# Patient Record
Sex: Male | Born: 1990 | Race: Black or African American | Hispanic: No | Marital: Single | Smoking: Never smoker
Health system: Southern US, Community
[De-identification: ages and names within clinical notes are randomized; demographics above are authoritative.]

## PROBLEM LIST (undated history)

## (undated) HISTORY — PX: FINGER SURGERY: SHX640

---
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
## 2011-03-14 ENCOUNTER — Emergency Department (HOSPITAL_BASED_OUTPATIENT_CLINIC_OR_DEPARTMENT_OTHER)
Admission: EM | Admit: 2011-03-14 | Discharge: 2011-03-14 | Disposition: A | Discharge status: Home / Self Care | Payer: Self-pay | Attending: Emergency Medicine | Admitting: Emergency Medicine

## 2011-03-14 ENCOUNTER — Encounter (HOSPITAL_BASED_OUTPATIENT_CLINIC_OR_DEPARTMENT_OTHER): Payer: Self-pay | Admitting: *Deleted

## 2011-03-14 ENCOUNTER — Emergency Department (INDEPENDENT_AMBULATORY_CARE_PROVIDER_SITE_OTHER): Payer: Self-pay

## 2011-03-14 DIAGNOSIS — R1031 Right lower quadrant pain: Secondary | ICD-10-CM | POA: Insufficient documentation

## 2011-03-14 DIAGNOSIS — I880 Nonspecific mesenteric lymphadenitis: Secondary | ICD-10-CM | POA: Insufficient documentation

## 2011-03-14 LAB — DIFFERENTIAL
Eosinophils Relative: 10 % — ABNORMAL HIGH (ref 0–5)
Lymphs Abs: 1.8 10*3/uL (ref 0.7–4.0)
Monocytes Absolute: 0.9 10*3/uL (ref 0.1–1.0)

## 2011-03-14 LAB — URINALYSIS, ROUTINE W REFLEX MICROSCOPIC
Glucose, UA: NEGATIVE mg/dL
Ketones, ur: NEGATIVE mg/dL
Leukocytes, UA: NEGATIVE
Nitrite: NEGATIVE
Specific Gravity, Urine: 1.017 (ref 1.005–1.030)
pH: 6.5 (ref 5.0–8.0)

## 2011-03-14 LAB — COMPREHENSIVE METABOLIC PANEL
Alkaline Phosphatase: 57 U/L (ref 39–117)
BUN: 10 mg/dL (ref 6–23)
CO2: 27 mEq/L (ref 19–32)
GFR calc Af Amer: >90 mL/min (ref >90)
GFR calc non Af Amer: >90 mL/min (ref >90)
Glucose, Bld: 84 mg/dL (ref 70–99)
Potassium: 3.9 mEq/L (ref 3.5–5.1)
Total Bilirubin: 0.2 mg/dL — ABNORMAL LOW (ref 0.3–1.2)
Total Protein: 6.5 g/dL (ref 6.0–8.3)

## 2011-03-14 LAB — CBC
HCT: 38.8 % — ABNORMAL LOW (ref 39.0–52.0)
Hemoglobin: 13.3 g/dL (ref 13.0–17.0)
Platelets: 243 10*3/uL (ref 150–400)
RBC: 5.58 MIL/uL (ref 4.22–5.81)
RDW: 14.2 % (ref 11.5–15.5)
WBC: 6.3 10*3/uL (ref 4.0–10.5)

## 2011-03-14 MED ORDER — SODIUM CHLORIDE 0.9 % IV BOLUS (SEPSIS)
1000.0000 mL | Freq: Once | INTRAVENOUS | Status: AC
  Administered 2011-03-14: 1000 mL via INTRAVENOUS

## 2011-03-14 MED ORDER — IBUPROFEN 600 MG PO TABS: 600.0000 mg | ORAL_TABLET | Freq: Four times a day (QID) | ORAL | Status: AC | PRN

## 2011-03-14 MED ORDER — MORPHINE SULFATE 4 MG/ML IJ SOLN
4.0000 mg | Freq: Once | INTRAMUSCULAR | Status: AC
  Administered 2011-03-14: 4 mg via INTRAVENOUS
  Filled 2011-03-14: qty 1

## 2011-03-14 MED ORDER — IOHEXOL 300 MG/ML  SOLN
100.0000 mL | Freq: Once | INTRAMUSCULAR | Status: AC | PRN
  Administered 2011-03-14: 100 mL via INTRAVENOUS

## 2011-03-14 NOTE — ED Notes (Signed)
PO contrast provided. Family at bedside.

## 2011-03-14 NOTE — ED Notes (Signed)
Report received from Mary, RN, care assumed.

## 2011-03-14 NOTE — ED Notes (Signed)
Pt presnts to ED today with c/o RLQ pain with associated n/v since Friday.  Pt took Pepto last night with no relief in sx

## 2011-03-14 NOTE — ED Provider Notes (Signed)
History     CSN: [id number]  Arrival date & time 03/14/11  6295   First MD Initiated Contact with Patient 03/14/11 [phone]      Chief Complaint  Patient presents with  . Abdominal Pain    (Consider location/radiation/quality/duration/timing/severity/associated sxs/prior treatment) HPI  21 year old male previous healthy presents with abdominal pain. Patient states he has experience right lower quadrant abdominal pain which is been constant for the past 3 days. Exacerbating factors include moving around in ED. He denies fevers, chills. He denies constipation, diarrhea. he's had nausea without vomiting. Denies back pain. Denies pain in his genitals. No history of abdominal surgeries. States he's occasionally felt bulge to the same area. Denies hematuria/dysuria/freq/urgency   ED Notes, ED Provider Notes from 03/14/11 0000 to 03/14/11 06:53:19       Trula Ore, RN 03/14/2011 06:52      Pt presnts to ED today with c/o RLQ pain with associated n/v since Friday. Pt took Pepto last night with no relief in sx    History reviewed. No pertinent past medical history.  History reviewed. No pertinent past surgical history.  History reviewed. No pertinent family history.  History  Substance Use Topics  . Smoking status: Not on file  . Smokeless tobacco: Not on file  . Alcohol Use: Not on file    Review of Systems  All other systems reviewed and are negative.   except as noted HPI   Allergies  Review of patient's allergies indicates no known allergies.  Home Medications   Current Outpatient Rx  Name Route Sig Dispense Refill  . IBUPROFEN 600 MG PO TABS Oral Take 1 tablet (600 mg total) by mouth every 6 (six) hours as needed for pain. 30 tablet 0    BP 108/64  Pulse 84  Temp(Src) 97.8 F (36.6 C) (Oral)  Resp 20  Ht 5\' 11"  (1.803 m)  Wt 140 lb (63.504 kg)  BMI 19.53 kg/m2  SpO2 100%  Physical Exam  Nursing note and vitals reviewed. Constitutional: He is oriented to  person, place, and time. He appears well-developed and well-nourished. No distress.  HENT:  Head: Atraumatic.  Mouth/Throat: Oropharynx is clear and moist.  Eyes: Conjunctivae are normal. Pupils are equal, round, and reactive to light.  Neck: Neck supple.  Cardiovascular: Normal rate, regular rhythm, normal heart sounds and intact distal pulses.  Exam reveals no gallop and no friction rub.   No murmur heard. Pulmonary/Chest: Effort normal. No respiratory distress. He has no wheezes. He has no rales.  Abdominal: Soft. Bowel sounds are normal. There is tenderness. There is no rebound and no guarding.       +RLQ ttp no hernia palpated  Genitourinary: Penis normal. No penile tenderness.  Musculoskeletal: Normal range of motion. He exhibits no edema and no tenderness.  Neurological: He is alert and oriented to person, place, and time.  Skin: Skin is warm and dry.  Psychiatric: He has a normal mood and affect.    ED Course  Procedures (including critical care time)  Labs Reviewed  CBC - Abnormal; Notable for the following:    HCT 38.8 (*)    MCV 69.5 (*)    MCH 23.8 (*)    All other components within normal limits  DIFFERENTIAL - Abnormal; Notable for the following:    Monocytes Relative 14 (*)    Eosinophils Relative 10 (*)    All other components within normal limits  COMPREHENSIVE METABOLIC PANEL - Abnormal; Notable for the following:  Total Bilirubin 0.2 (*)    All other components within normal limits  URINALYSIS, ROUTINE W REFLEX MICROSCOPIC   Ct Abdomen Pelvis W Contrast  03/14/2011  *RADIOLOGY REPORT*  Clinical Data: Right lower quadrant abdominal pain.  CT ABDOMEN AND PELVIS WITH CONTRAST  Technique:  Multidetector CT imaging of the abdomen and pelvis was performed following the standard protocol during bolus administration of intravenous contrast.  Contrast: OMNIPAQUE IOHEXOL 300 MG/ML IV SOLN  Comparison: None  Findings: The lung bases are clear.  The solid abdominal  organs are normal.  The stomach, duodenum, small bowel and colon demonstrate no significant abnormalities.  The appendix is visualized and is normal.  The bladder, prostate gland and seminal vesicles are normal.  No pelvic mass or adenopathy.  There are scattered borderline mesenteric lymph nodes which may suggest mesenteric adenitis.  The aorta is normal in caliber.  The major branch vessels are normal.  The bony structures are intact.  IMPRESSION:  1.  No CT findings for acute appendicitis. 2.  Borderline mesenteric lymph nodes may suggest mesenteric adenitis.  Original Report Authenticated By: P. Loralie Champagne, M.D.     1. Mesenteric adenitis      MDM   Patient appears well. His vital signs are stable. He is right lower quadrant tenderness to palpation on exam. His genital exam is unremarkable. CT abdomen and pelvis to evaluate for appendicitis. Differential diagnosis also includes hernia although not palpable on exam, muscular skeletal pain, likely colitis, UTI. IV fluids, morphine ordered. Reassess.  Labs reviewed and generally unremarkable. CT abdomen pelvis with mesenteric adenitis. Repeat abdominal exam with mild right lower quadrant tenderness to palpation, no rebound and no guarding. Pain control, primary care followup. Patient comfortable with the plan.       Forbes Cellar, MD 03/14/11 [phone]

## 2011-03-14 NOTE — ED Notes (Signed)
Patient transported to CT via stretcher.

## 2011-03-14 NOTE — ED Notes (Signed)
MD at bedside. 

## 2012-02-17 ENCOUNTER — Emergency Department (HOSPITAL_BASED_OUTPATIENT_CLINIC_OR_DEPARTMENT_OTHER)
Admission: EM | Admit: 2012-02-17 | Discharge: 2012-02-17 | Disposition: A | Discharge status: Home / Self Care | Payer: Medicaid Other | Attending: Emergency Medicine | Admitting: Emergency Medicine

## 2012-02-17 ENCOUNTER — Encounter (HOSPITAL_BASED_OUTPATIENT_CLINIC_OR_DEPARTMENT_OTHER): Payer: Self-pay

## 2012-02-17 DIAGNOSIS — R300 Dysuria: Secondary | ICD-10-CM | POA: Insufficient documentation

## 2012-02-17 DIAGNOSIS — A64 Unspecified sexually transmitted disease: Secondary | ICD-10-CM | POA: Insufficient documentation

## 2012-02-17 MED ORDER — CEFTRIAXONE SODIUM 250 MG IJ SOLR
250.0000 mg | Freq: Once | INTRAMUSCULAR | Status: AC
  Administered 2012-02-17: 250 mg via INTRAMUSCULAR
  Filled 2012-02-17: qty 250

## 2012-02-17 MED ORDER — AZITHROMYCIN 250 MG PO TABS
1000.0000 mg | ORAL_TABLET | Freq: Once | ORAL | Status: AC
  Administered 2012-02-17: 1000 mg via ORAL
  Filled 2012-02-17: qty 4

## 2012-02-17 MED ORDER — LIDOCAINE HCL (PF) 1 % IJ SOLN
INTRAMUSCULAR | Status: AC
  Administered 2012-02-17: 1 mL
  Filled 2012-02-17: qty 5
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
## 2012-02-17 NOTE — ED Notes (Signed)
Pt states that he has dysuria, and penile discharge.  Pt states that this is the first time he has been seen for this complaint.  Onset 1 week ago.  Pt is sexually active, and reports "occasionally" using condoms.

## 2012-02-21 LAB — GC/CHLAMYDIA PROBE AMP: CT Probe RNA: POSITIVE — AB

## 2012-02-22 NOTE — ED Notes (Signed)
+  Chlamydia. Patient treated with Rocephin and Zithromax. [employer] faxed. 

## 2012-02-28 ENCOUNTER — Encounter (HOSPITAL_BASED_OUTPATIENT_CLINIC_OR_DEPARTMENT_OTHER): Payer: Self-pay | Admitting: *Deleted

## 2012-02-28 ENCOUNTER — Emergency Department (HOSPITAL_BASED_OUTPATIENT_CLINIC_OR_DEPARTMENT_OTHER)
Admission: EM | Admit: 2012-02-28 | Discharge: 2012-02-28 | Disposition: A | Discharge status: Home / Self Care | Payer: Medicaid Other | Attending: Emergency Medicine | Admitting: Emergency Medicine

## 2012-02-28 DIAGNOSIS — R197 Diarrhea, unspecified: Secondary | ICD-10-CM | POA: Insufficient documentation

## 2012-02-28 DIAGNOSIS — R5381 Other malaise: Secondary | ICD-10-CM | POA: Insufficient documentation

## 2012-02-28 DIAGNOSIS — B9789 Other viral agents as the cause of diseases classified elsewhere: Secondary | ICD-10-CM | POA: Insufficient documentation

## 2012-02-28 DIAGNOSIS — B349 Viral infection, unspecified: Secondary | ICD-10-CM

## 2012-02-28 LAB — URINE MICROSCOPIC-ADD ON

## 2012-02-28 LAB — CBC WITH DIFFERENTIAL/PLATELET
Basophils Absolute: 0 10*3/uL (ref 0.0–0.1)
HCT: 43.7 % (ref 39.0–52.0)
Lymphs Abs: 0.9 10*3/uL (ref 0.7–4.0)
MCH: 23.4 pg — ABNORMAL LOW (ref 26.0–34.0)
MCHC: 33.4 g/dL (ref 30.0–36.0)
MCV: 70 fL — ABNORMAL LOW (ref 78.0–100.0)
Monocytes Absolute: 1.2 10*3/uL — ABNORMAL HIGH (ref 0.1–1.0)
Neutro Abs: 12.9 10*3/uL — ABNORMAL HIGH (ref 1.7–7.7)
Platelets: 268 10*3/uL (ref 150–400)
RDW: 15 % (ref 11.5–15.5)

## 2012-02-28 LAB — URINALYSIS, ROUTINE W REFLEX MICROSCOPIC
Bilirubin Urine: NEGATIVE
Ketones, ur: NEGATIVE mg/dL
Protein, ur: NEGATIVE mg/dL
Urobilinogen, UA: 0.2 mg/dL (ref 0.0–1.0)

## 2012-02-28 LAB — BASIC METABOLIC PANEL
BUN: 11 mg/dL (ref 6–23)
Calcium: 9.7 mg/dL (ref 8.4–10.5)
Creatinine, Ser: 1 mg/dL (ref 0.50–1.35)
GFR calc Af Amer: >90 mL/min (ref >90)

## 2012-02-28 MED ORDER — ONDANSETRON HCL 4 MG/2ML IJ SOLN
4.0000 mg | Freq: Once | INTRAMUSCULAR | Status: AC
  Administered 2012-02-28: 4 mg via INTRAVENOUS
  Filled 2012-02-28: qty 2

## 2012-02-28 MED ORDER — PROMETHAZINE HCL 25 MG PO TABS: 25.0000 mg | ORAL_TABLET | Freq: Four times a day (QID) | ORAL | Status: AC | PRN

## 2012-02-28 MED ORDER — SODIUM CHLORIDE 0.9 % IV BOLUS (SEPSIS)
1000.0000 mL | Freq: Once | INTRAVENOUS | Status: AC
  Administered 2012-02-28: 1000 mL via INTRAVENOUS
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
## 2012-02-28 NOTE — ED Notes (Signed)
Vomiting and diarrhea since 6am. Vomited x 3 and diarrhea x 4. Abdominal cramps.

## 2012-07-07 ENCOUNTER — Encounter (HOSPITAL_BASED_OUTPATIENT_CLINIC_OR_DEPARTMENT_OTHER): Payer: Self-pay

## 2012-07-07 ENCOUNTER — Emergency Department (HOSPITAL_BASED_OUTPATIENT_CLINIC_OR_DEPARTMENT_OTHER)
Admission: EM | Admit: 2012-07-07 | Discharge: 2012-07-07 | Disposition: A | Discharge status: Home / Self Care | Payer: Medicaid Other | Attending: Emergency Medicine | Admitting: Emergency Medicine

## 2012-07-07 DIAGNOSIS — S335XXA Sprain of ligaments of lumbar spine, initial encounter: Secondary | ICD-10-CM | POA: Insufficient documentation

## 2012-07-07 DIAGNOSIS — S39012A Strain of muscle, fascia and tendon of lower back, initial encounter: Secondary | ICD-10-CM

## 2012-07-07 DIAGNOSIS — Y92410 Unspecified street and highway as the place of occurrence of the external cause: Secondary | ICD-10-CM | POA: Insufficient documentation

## 2012-07-07 DIAGNOSIS — Y9389 Activity, other specified: Secondary | ICD-10-CM | POA: Insufficient documentation

## 2012-07-07 NOTE — ED Notes (Signed)
Robyn, PA-C at bedside 
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
## 2012-07-08 NOTE — ED Provider Notes (Signed)
Medical screening examination/treatment/procedure(s) were performed by non-physician practitioner and as supervising physician I was immediately available for consultation/collaboration.  Leeum Sankey, MD 07/08/12 0807 

## 2014-06-25 ENCOUNTER — Encounter (HOSPITAL_BASED_OUTPATIENT_CLINIC_OR_DEPARTMENT_OTHER): Payer: Self-pay | Admitting: Emergency Medicine

## 2014-06-25 ENCOUNTER — Emergency Department (HOSPITAL_BASED_OUTPATIENT_CLINIC_OR_DEPARTMENT_OTHER)
Admission: EM | Admit: 2014-06-25 | Discharge: 2014-06-25 | Disposition: A | Discharge status: Home / Self Care | Payer: 59 | Attending: Emergency Medicine | Admitting: Emergency Medicine

## 2014-06-25 DIAGNOSIS — N485 Ulcer of penis: Secondary | ICD-10-CM

## 2014-06-25 DIAGNOSIS — R1030 Lower abdominal pain, unspecified: Secondary | ICD-10-CM | POA: Diagnosis present

## 2014-06-25 LAB — URINALYSIS, ROUTINE W REFLEX MICROSCOPIC
Bilirubin Urine: NEGATIVE
GLUCOSE, UA: NEGATIVE mg/dL
Hgb urine dipstick: NEGATIVE
KETONES UR: NEGATIVE mg/dL
LEUKOCYTES UA: NEGATIVE
Nitrite: NEGATIVE
PH: 7.5 (ref 5.0–8.0)
Protein, ur: NEGATIVE mg/dL
Specific Gravity, Urine: 1.025 (ref 1.005–1.030)
Urobilinogen, UA: 0.2 mg/dL (ref 0.0–1.0)

## 2014-06-25 MED ORDER — VALACYCLOVIR HCL 1 G PO TABS: 1000.0000 mg | ORAL_TABLET | Freq: Two times a day (BID) | ORAL | Status: AC
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
## 2014-06-25 NOTE — Discharge Instructions (Signed)
Take valtrex as prescribed.   Take tylenol, motrin for pain.  You will be called if you have STD or syphilis.   Follow up with your doctor.   Return to ER if you have worse ulcers, penile discharge, testicular pain.

## 2014-06-25 NOTE — ED Notes (Signed)
[facility]-Spoke with them at length about viral STD tx prevention

## 2014-06-25 NOTE — ED Notes (Signed)
MD at bedside. 

## 2014-06-27 LAB — GC/CHLAMYDIA PROBE AMP (~~LOC~~) NOT AT ARMC
CHLAMYDIA, DNA PROBE: NEGATIVE
NEISSERIA GONORRHEA: NEGATIVE

## 2014-06-27 LAB — RPR: RPR Ser Ql: NONREACTIVE

## 2020-04-26 ENCOUNTER — Emergency Department (HOSPITAL_BASED_OUTPATIENT_CLINIC_OR_DEPARTMENT_OTHER)
Admission: EM | Admit: 2020-04-26 | Discharge: 2020-04-27 | Disposition: A | Discharge status: Home / Self Care | Payer: Self-pay | Attending: Emergency Medicine | Admitting: Emergency Medicine

## 2020-04-26 ENCOUNTER — Emergency Department (HOSPITAL_BASED_OUTPATIENT_CLINIC_OR_DEPARTMENT_OTHER): Payer: Self-pay

## 2020-04-26 ENCOUNTER — Encounter (HOSPITAL_BASED_OUTPATIENT_CLINIC_OR_DEPARTMENT_OTHER): Payer: Self-pay | Admitting: Emergency Medicine

## 2020-04-26 DIAGNOSIS — S7001XA Contusion of right hip, initial encounter: Secondary | ICD-10-CM | POA: Diagnosis not present

## 2020-04-26 DIAGNOSIS — S79911A Unspecified injury of right hip, initial encounter: Secondary | ICD-10-CM | POA: Diagnosis present

## 2020-04-26 DIAGNOSIS — S060X0A Concussion without loss of consciousness, initial encounter: Secondary | ICD-10-CM | POA: Insufficient documentation

## 2020-04-26 DIAGNOSIS — Y92410 Unspecified street and highway as the place of occurrence of the external cause: Secondary | ICD-10-CM | POA: Diagnosis not present

## 2020-04-26 MED ORDER — ONDANSETRON HCL 4 MG PO TABS: 4.0000 mg | ORAL_TABLET | Freq: Four times a day (QID) | ORAL | 0 refills | Status: AC | PRN

## 2020-04-26 MED ORDER — ONDANSETRON 4 MG PO TBDP
8.0000 mg | ORAL_TABLET | Freq: Once | ORAL | Status: AC
  Administered 2020-04-26: 8 mg via ORAL
  Filled 2020-04-26: qty 2
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
## 2021-02-08 ENCOUNTER — Other Ambulatory Visit: Payer: Self-pay

## 2021-02-08 ENCOUNTER — Emergency Department (HOSPITAL_BASED_OUTPATIENT_CLINIC_OR_DEPARTMENT_OTHER)
Admission: EM | Admit: 2021-02-08 | Discharge: 2021-02-08 | Disposition: A | Discharge status: Home / Self Care | Payer: 59 | Attending: Emergency Medicine | Admitting: Emergency Medicine

## 2021-02-08 ENCOUNTER — Encounter (HOSPITAL_BASED_OUTPATIENT_CLINIC_OR_DEPARTMENT_OTHER): Payer: Self-pay

## 2021-02-08 DIAGNOSIS — Z20822 Contact with and (suspected) exposure to covid-19: Secondary | ICD-10-CM | POA: Insufficient documentation

## 2021-02-08 DIAGNOSIS — R059 Cough, unspecified: Secondary | ICD-10-CM | POA: Diagnosis present

## 2021-02-08 DIAGNOSIS — M791 Myalgia, unspecified site: Secondary | ICD-10-CM | POA: Insufficient documentation

## 2021-02-08 DIAGNOSIS — J069 Acute upper respiratory infection, unspecified: Secondary | ICD-10-CM | POA: Insufficient documentation

## 2021-02-08 LAB — RESP PANEL BY RT-PCR (FLU A&B, COVID) ARPGX2
Influenza A by PCR: NEGATIVE
Influenza B by PCR: NEGATIVE
SARS Coronavirus 2 by RT PCR: NEGATIVE
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
## 2021-02-08 NOTE — ED Notes (Signed)
States daughter had the flu a week ago °

## 2021-02-08 NOTE — Discharge Instructions (Addendum)
You were seen in the emergency department for a cough. ° °As we discussed you tested negative for COVID and flu.  However there are several respiratory viruses that are going around at this time of year, and I think that that is what you have.  We normally treat these with over-the-counter medications like ibuprofen or Tylenol as needed for fever or pain.  You can take decongestants like Mucinex, if you do this make sure you are drinking lots of water.  I also recommend saline rinses, that you can buy from the drugstore.  I have attached instructions for how to use this. ° °Continue to monitor how you're doing and return to the ER for new or worsening symptoms such as fever despite medication, or difficulty breathing ° °It has been a pleasure seeing and caring for you today and I hope you start feeling better soon!  °

## 2021-02-08 NOTE — ED Provider Notes (Signed)
[facility] EMERGENCY DEPARTMENT Provider Note   CSN: [id number] Arrival date & time: 02/08/21  1943     History  Chief Complaint  Patient presents with   Cough    Neil Dunn is a 31 y.o. male with no significant past medical history who presents emergency department for flu like symptoms for 1 week.  Partner is here with similar symptoms, and she states that her daughter had the flu a week ago.   Cough Associated symptoms: chills and myalgias   Associated symptoms: no chest pain, no fever, no shortness of breath and no sore throat       Home Medications Prior to Admission medications   Medication Sig Start Date End Date Taking? Authorizing Provider  acetaminophen (TYLENOL) 500 MG tablet Take 500 mg by mouth every 6 (six) hours as needed for pain.    [provider]  ondansetron (ZOFRAN) 4 MG tablet Take 1 tablet (4 mg total) by mouth every 6 (six) hours as needed for nausea or vomiting. 04/26/20   Tilden Fossa, MD  promethazine (PHENERGAN) 25 MG tablet Take 1 tablet (25 mg total) by mouth every 6 (six) hours as needed for nausea. 02/28/12   Rolan Bucco, MD      Allergies    Patient has no known allergies.    Review of Systems   Review of Systems  Constitutional:  Positive for appetite change and chills. Negative for fever.  HENT:  Positive for congestion. Negative for sore throat and trouble swallowing.   Respiratory:  Positive for cough. Negative for shortness of breath.   Cardiovascular:  Negative for chest pain.  Gastrointestinal:  Positive for diarrhea and nausea. Negative for abdominal pain, constipation and vomiting.  Genitourinary:  Negative for dysuria and flank pain.  Musculoskeletal:  Positive for myalgias.  All other systems reviewed and are negative.  Physical Exam Updated Vital Signs BP 124/79 (BP Location: Right Arm)    Pulse 78    Temp 99 F (37.2 C) (Oral)    Resp 16    SpO2 100%  Physical Exam Vitals and nursing note  reviewed.  Constitutional:      Appearance: Normal appearance.  HENT:     Head: Normocephalic and atraumatic.  Eyes:     Conjunctiva/sclera: Conjunctivae normal.  Cardiovascular:     Rate and Rhythm: Normal rate and regular rhythm.  Pulmonary:     Effort: Pulmonary effort is normal. No respiratory distress.     Breath sounds: Normal breath sounds.  Abdominal:     General: There is no distension.     Palpations: Abdomen is soft.     Tenderness: There is no abdominal tenderness.  Skin:    General: Skin is warm and dry.  Neurological:     General: No focal deficit present.     Mental Status: He is alert.    ED Results / Procedures / Treatments   Labs (all labs ordered are listed, but only abnormal results are displayed) Labs Reviewed  RESP PANEL BY RT-PCR (FLU A&B, COVID) ARPGX2    EKG None  Radiology No results found.  Procedures Procedures    Medications Ordered in ED Medications - No data to display  ED Course/ Medical Decision Making/ A&P                           Medical Decision Making Patient is otherwise healthy 31 year old male who presents emergency department for flulike symptoms for 1  week.    Differential diagnosis for emergent cause of cough includes but is not limited to upper respiratory infection, lower respiratory infection, allergies, asthma, irritants, foreign body, medications such as ACE inhibitors, reflux, asthma, CHF, lung cancer, interstitial lung disease, psychiatric causes, postnasal drip and postinfectious bronchospasm.   Patient tested negative for COVID and flu.  On exam he is afebrile, not tachycardic, not hypoxic, and in no acute distress.  Lung sounds are clear to auscultation all fields.  Abdomen is soft, nontender nondistended.  Patient does not clinically appear dehydrated.    I do not believe he is requiring imaging of his chest, as he does not have a productive cough and his lung sounds are clear.  I also do not believe he is  requiring mission or inpatient treatment for his symptoms.  We will treat for likely viral illness with cough, with symptomatic management in the outpatient setting.  Discussed reasons to return to the emergency department, patient agreeable plan.  Final Clinical Impression(s) / ED Diagnoses Final diagnoses:  Viral URI with cough    Rx / DC Orders ED Discharge Orders     None      Portions of this report may have been transcribed using voice recognition software. Every effort was made to ensure accuracy; however, inadvertent computerized transcription errors may be present.    Jeanella Flattery 02/08/21 2349    Maia Plan, MD 02/14/21 [phone]
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
